# Patient Record
Sex: Male | Born: 2000 | Race: White | Hispanic: No | Marital: Single
Health system: Midwestern US, Community
[De-identification: ages and names within clinical notes are randomized; demographics above are authoritative.]

## PROBLEM LIST (undated history)

## (undated) DIAGNOSIS — T148XXA Other injury of unspecified body region, initial encounter: Secondary | ICD-10-CM

## (undated) DIAGNOSIS — N4400 Torsion of testis, unspecified: Secondary | ICD-10-CM

## (undated) DIAGNOSIS — G8929 Other chronic pain: Secondary | ICD-10-CM

## (undated) DIAGNOSIS — S43004A Unspecified dislocation of right shoulder joint, initial encounter: Principal | ICD-10-CM

## (undated) DIAGNOSIS — M545 Low back pain, unspecified: Secondary | ICD-10-CM

## (undated) DIAGNOSIS — M25311 Other instability, right shoulder: Principal | ICD-10-CM

## (undated) DIAGNOSIS — R102 Pelvic and perineal pain: Principal | ICD-10-CM

## (undated) DIAGNOSIS — Z0000 Encounter for general adult medical examination without abnormal findings: Principal | ICD-10-CM

## (undated) DIAGNOSIS — F419 Anxiety disorder, unspecified: Principal | ICD-10-CM

---
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
## 2022-12-09 ENCOUNTER — Emergency Department: Admit: 2022-12-10 | Payer: PRIVATE HEALTH INSURANCE

## 2022-12-09 DIAGNOSIS — N50812 Left testicular pain: Secondary | ICD-10-CM

## 2022-12-09 NOTE — ED Provider Notes (Signed)
[facility] EMERGENCY DEPT  EMERGENCY DEPARTMENT ENCOUNTER      Pt Name: Raymond Holder  MRN: [id number]  Birthdate 2001/03/18  Date of evaluation: 12/09/2022  Provider: Vanice Sarah, PA-C  4:00 AM    CHIEF COMPLAINT       Chief Complaint   Patient presents with    Abdominal Pain    Testicle Pain         HISTORY OF PRESENT ILLNESS    Raymond Holder is a 22 y.o. male who presents to the emergency department      22 y/o WM with c/o LLQ pain x 1 day.  He reports LLQ pain that radiates into his left testicle.  He denies testicular swelling, penile discharge, hematuria, n/v or flank pain. He had one episode of dysuria earlier today, but assumed it was due to be dehydrated. He reports h/o testicular torsion at 22 years old    The history is provided by the patient.       Nursing Notes were reviewed.    REVIEW OF SYSTEMS       Review of Systems   Constitutional: Negative.    HENT: Negative.     Respiratory: Negative.     Gastrointestinal:  Positive for abdominal pain.   Genitourinary:  Positive for testicular pain.   Musculoskeletal: Negative.    Skin: Negative.    Neurological: Negative.    Psychiatric/Behavioral: Negative.         Except as noted above the remainder of the review of systems was reviewed and negative.       PAST MEDICAL HISTORY   No past medical history on file.      SURGICAL HISTORY     No past surgical history on file.      CURRENT MEDICATIONS       Discharge Medication List as of 12/10/2022  1:43 AM          ALLERGIES     Patient has no known allergies.    FAMILY HISTORY     No family history on file.       SOCIAL HISTORY       Social History     Socioeconomic History    Marital status: Single       SCREENINGS         Glasgow Coma Scale  Eye Opening: Spontaneous  Best Verbal Response: Oriented  Best Motor Response: Obeys commands  Glasgow Coma Scale Score: 15                     CIWA Assessment  BP: 132/80  Pulse: 65                 PHYSICAL EXAM       ED Triage Vitals [12/09/22 2247]   BP Temp  Temp Source Pulse Respirations SpO2 Height Weight - Scale   137/82 98.8 F (37.1 C) Oral 79 18 97 % 1.88 m (6\' 2" ) 83.9 kg (185 lb)       Physical Exam  Vitals and nursing note reviewed.   Constitutional:       General: He is not in acute distress.     Appearance: He is well-developed. He is not ill-appearing.   HENT:      Head: Normocephalic and atraumatic.   Pulmonary:      Effort: Pulmonary effort is normal. No respiratory distress.   Abdominal:      General: Abdomen is flat.  Tenderness: There is abdominal tenderness in the left lower quadrant. There is no left CVA tenderness.   Genitourinary:     Testes:         Right: Swelling not present.         Left: Swelling not present.   Skin:     General: Skin is warm.      Findings: No rash.   Neurological:      Mental Status: He is oriented to person, place, and time.   Psychiatric:         Mood and Affect: Mood normal.         Behavior: Behavior normal.         DIAGNOSTIC RESULTS         RADIOLOGY:   Non-plain film images such as CT, Ultrasound and MRI are read by the radiologist. Plain radiographic images are visualized and preliminarily interpreted by the emergency physician with the below findings:        Interpretation per the Radiologist below, if available at the time of this note:    US SCROTUM W COMPLETE DUPLEX   Final Result      No evidence of intratesticular mass, torsion, or obvious vascular asymmetry.      CT ABDOMEN PELVIS WO CONTRAST Additional Contrast? None    (Results Pending)         LABS:  Labs Reviewed   URINALYSIS W/ RFLX MICROSCOPIC - Abnormal; Notable for the following components:       Result Value    Specific Gravity, UA >=1.030 (*)     All other components within normal limits   COMPREHENSIVE METABOLIC PANEL - Abnormal; Notable for the following components:    Potassium 3.4 (*)     Glucose 127 (*)     All other components within normal limits   C.TRACHOMATIS N.GONORRHOEAE T. VAGINALI, MOLECULAR, URINE    CBC WITH AUTO DIFFERENTIAL    LIPASE   MAGNESIUM       All other labs were within normal range or not returned as of this dictation.    EMERGENCY DEPARTMENT COURSE and DIFFERENTIAL DIAGNOSIS/MDM:   Vitals:    Vitals:    12/10/22 0100 12/10/22 0115 12/10/22 0130 12/10/22 0145   BP: 132/80      Pulse: 65      Resp: 16      Temp:       TempSrc:       SpO2: 97% 98% 98% 97%   Weight:       Height:               Medical Decision Making  Amount and/or Complexity of Data Reviewed  Labs: ordered.  Radiology: ordered.    Risk  Prescription drug management.          FINAL IMPRESSION      1. Abdominal pain, left lower quadrant    2. Testicular pain, left          DISPOSITION/PLAN   DISPOSITION Decision To Discharge 12/10/2022 01:41:57 AM      PATIENT REFERRED TO:  [facility] EMERGENCY DEPT  [address]  [place] [address]  [phone]    As needed      DISCHARGE MEDICATIONS:  Discharge Medication List as of 12/10/2022  1:43 AM        START taking these medications    Details   ibuprofen (ADVIL;MOTRIN) 800 MG tablet Take 1 tablet by mouth every 8 hours as needed for  Pain, Disp-20 tablet, R-0Print      tiZANidine (ZANAFLEX) 4 MG tablet Take 1 tablet by mouth every 8 hours as needed (muscle pain), Disp-15 tablet, R-0Print           Controlled Substances Monitoring:          No data to display                (Please note that portions of this note were completed with a voice recognition program.  Efforts were made to edit the dictations but occasionally words are mis-transcribed.)    Farida Mcreynolds Koleen Nimrod, PA-C (electronically signed)  Attending Emergency Physician           Vanice Sarah, PA-C  12/10/22 0400

## 2022-12-09 NOTE — ED Notes (Signed)
Patient not in waiting room to triage

## 2022-12-10 ENCOUNTER — Inpatient Hospital Stay
Admit: 2022-12-10 | Discharge: 2022-12-10 | Disposition: A | Discharge status: Home / Self Care | Payer: PRIVATE HEALTH INSURANCE

## 2022-12-10 ENCOUNTER — Emergency Department: Admit: 2022-12-10 | Payer: PRIVATE HEALTH INSURANCE

## 2022-12-10 LAB — CBC WITH AUTO DIFFERENTIAL
Basophils %: 0.6 % (ref 0.0–2.0)
Basophils Absolute: 0 10*3/uL (ref 0.0–0.2)
Eosinophils %: 2.4 % (ref 0.0–7.0)
Eosinophils Absolute: 0.2 10*3/uL (ref 0.0–0.5)
Hematocrit: 44.2 % (ref 38.0–52.0)
Hemoglobin: 15.8 g/dL (ref 13.0–17.3)
Immature Grans (Abs): 0.01 10*3/uL (ref 0.00–0.06)
Immature Granulocytes %: 0.1 % (ref 0.0–0.6)
Lymphocytes Absolute: 2.7 10*3/uL (ref 1.0–3.2)
Lymphocytes: 40.1 % (ref 15.0–45.0)
MCH: 31.9 pg (ref 27.0–34.5)
MCHC: 35.7 g/dL (ref 32.0–36.0)
MCV: 89.1 fL (ref 84.0–100.0)
MPV: 9.4 fL (ref 7.2–13.2)
Monocytes %: 7.4 % (ref 4.0–12.0)
Monocytes Absolute: 0.5 10*3/uL (ref 0.3–1.0)
NRBC Absolute: 0 10*3/uL (ref 0.000–0.012)
NRBC Automated: 0 % (ref 0.0–0.2)
Neutrophils %: 49.4 % (ref 42.0–74.0)
Neutrophils Absolute: 3.3 10*3/uL (ref 1.6–7.3)
Platelets: 198 10*3/uL (ref 140–440)
RBC: 4.96 x10e6/mcL (ref 4.00–5.60)
RDW: 11.5 % (ref 11.0–16.0)
WBC: 6.8 10*3/uL (ref 3.8–10.6)

## 2022-12-10 LAB — COMPREHENSIVE METABOLIC PANEL
ALT: 45 U/L (ref 0–50)
AST: 27 U/L (ref 0–50)
Albumin/Globulin Ratio: 1.59 (ref 1.00–2.70)
Albumin: 4.3 g/dL (ref 3.5–5.2)
Alk Phosphatase: 82 U/L (ref 40–130)
Anion Gap: 10 mmol/L (ref 2–17)
BUN: 19 mg/dL (ref 6–20)
CO2: 26 mmol/L (ref 22–29)
Calcium: 9.1 mg/dL (ref 8.5–10.7)
Chloride: 103 mmol/L (ref 98–107)
Creatinine: 0.8 mg/dL (ref 0.7–1.3)
Est, Glom Filt Rate: 128 mL/min/1.73m (ref >=60)
Globulin: 2.7 g/dL (ref 1.9–4.4)
Glucose: 127 mg/dL — ABNORMAL HIGH (ref 70–99)
Osmolaliy Calculated: 281 mOsm/kg (ref 270–287)
Potassium: 3.4 mmol/L — ABNORMAL LOW (ref 3.5–5.3)
Sodium: 139 mmol/L (ref 135–145)
Total Bilirubin: 1.1 mg/dL (ref 0.00–1.20)
Total Protein: 7 g/dL (ref 5.7–8.3)

## 2022-12-10 LAB — URINALYSIS W/ RFLX MICROSCOPIC
Bilirubin, Urine: NEGATIVE
Blood, Urine: NEGATIVE
Glucose, Ur: NEGATIVE mg/dL
Ketones, Urine: NEGATIVE mg/dL
Leukocyte Esterase, Urine: NEGATIVE
Nitrite, Urine: NEGATIVE
Protein, UA: NEGATIVE
Specific Gravity, UA: >=1.03 — AB (ref 1.003–1.035)
Urobilinogen, Urine: 1 EU/dL (ref >1.0)
pH, Urine: 5.5 (ref 4.5–8.0)

## 2022-12-10 LAB — MAGNESIUM: Magnesium: 2.1 mg/dL (ref 1.6–2.6)

## 2022-12-10 LAB — LIPASE: Lipase: 23 U/L (ref 13–60)

## 2022-12-10 MED ORDER — KETOROLAC TROMETHAMINE 15 MG/ML IJ SOLN
15 | Freq: Once | INTRAMUSCULAR | Status: AC
Start: 2022-12-10 — End: 2022-12-10
  Administered 2022-12-10: 04:00:00 15 mg via INTRAVENOUS

## 2022-12-10 MED ORDER — TIZANIDINE HCL 4 MG PO TABS
4 | ORAL_TABLET | Freq: Three times a day (TID) | ORAL | 0 refills | 28.00000 days | Status: DC | PRN
Start: 2022-12-10 — End: 2023-10-24

## 2022-12-10 MED ORDER — IBUPROFEN 800 MG PO TABS
800 | ORAL_TABLET | Freq: Three times a day (TID) | ORAL | 0 refills | 15.00000 days | Status: DC | PRN
Start: 2022-12-10 — End: 2023-12-05

## 2022-12-10 MED FILL — KETOROLAC TROMETHAMINE 15 MG/ML IJ SOLN: 15 MG/ML | INTRAMUSCULAR | Qty: 1

## 2022-12-11 LAB — C.TRACHOMATIS N.GONORRHOEAE T. VAGINALI, MOLECULAR, URINE
Chlamydia Trachomatis, NAA, Urine: NEGATIVE
Neisseria gonorrhoeae, NAA, Urine: NEGATIVE
Trichomonas vaginalis DNA, Urine: NEGATIVE

## 2023-10-24 ENCOUNTER — Ambulatory Visit: Admit: 2023-10-24 | Discharge: 2023-10-24 | Payer: PRIVATE HEALTH INSURANCE | Primary: Family Medicine

## 2023-10-24 ENCOUNTER — Ambulatory Visit
Admit: 2023-10-24 | Discharge: 2023-10-24 | Payer: PRIVATE HEALTH INSURANCE | Attending: Family Medicine | Primary: Family Medicine

## 2023-10-24 VITALS — BP 118/70 | HR 63 | Temp 98.30000°F | Resp 20 | Ht 74.0 in | Wt 183.4 lb

## 2023-10-24 DIAGNOSIS — M545 Low back pain, unspecified: Secondary | ICD-10-CM

## 2023-10-24 MED ORDER — NAPROXEN 500 MG PO TABS
500 | ORAL_TABLET | Freq: Two times a day (BID) | ORAL | 0 refills | Status: DC | PRN
Start: 2023-10-24 — End: 2023-11-18

## 2023-10-24 MED ORDER — CYCLOBENZAPRINE HCL 5 MG PO TABS
5 | ORAL_TABLET | Freq: Three times a day (TID) | ORAL | 0 refills | 10.00000 days | Status: DC | PRN
Start: 2023-10-24 — End: 2023-12-05

## 2023-10-24 NOTE — Progress Notes (Signed)
 Raymond Holder (DOB:  04-18-2001) is a 23 y.o. male, here for evaluation of the following chief complaint(s):  Back Pain (2 months ago, pt bent down in bathroom, felt sharp pain to lower back. Missed work for several days. Ongoing lower back discomfort and pain. Recently moved which made it worse. . No trauma or injury to spine. Took ibuprofen  for pain management. )        Assessment & Plan   ASSESSMENT/PLAN:  1. Chronic right-sided low back pain without sciatica  -     XR LUMBAR SPINE (2-3 VIEWS)  -     Amb External Referral To Physical Therapy  -     naproxen  (NAPROSYN ) 500 MG tablet; Take 1 tablet by mouth 2 times daily as needed for Pain Take with food., Disp-40 tablet, R-0Normal  -     cyclobenzaprine  (FLEXERIL ) 5 MG tablet; Take 1 tablet by mouth 3 times daily as needed for Muscle spasms, Disp-30 tablet, R-0Normal      Assessment & Plan  Chronic low back pain.  Potential disc bulge, lumbar strains possible. Tenderness in lower back, no issues with hip rotation or leg elevation. Discussed pathophysiology. Ordered spine x-ray. Referred to [facility] for 4 to 6 weeks. Prescribed naproxen  500 mg and Flexeril  10 mg. Advised to take naproxen  with food and avoid driving after Flexeril . If pain persists, consider spine MRI.    Follow-up in 5 weeks.       Return in about 5 weeks (around 11/28/2023) for back pain .       Subjective   SUBJECTIVE/OBJECTIVE:  HPI     History of Present Illness  The patient presents for evaluation of lower back pain.    Approximately 2 months ago, sudden, sharp pain occurred in the lower back while bending over. Similar episodes have occurred over the past 1 to 2 years without specific triggers. The most recent episode occurred while rising from the toilet, causing a pinching sensation and immobility for several days. Muscle relaxants, heat, and cold therapy were prescribed. Pain initially subsided but worsened after a recent move. Persistent discomfort described as a  tack-like sensation in the lower back has been experienced over the past few weeks. Movements are cautious to prevent aggravation. This is the third or fourth episode, occurring approximately 4 to 6 months apart. Pain occasionally radiates down the right leg but is not consistent. No weakness, numbness, or urinary/bowel issues reported.    PAST SURGICAL HISTORY: Testicular torsion surgery over 10 years ago. Shoulder anchors from dislocation in 2018.    SOCIAL HISTORY  Does not smoke or vape.    FAMILY HISTORY  Mother has had back troubles, including slipped disks.       Prior to Admission medications    Medication Sig Start Date End Date Taking? Authorizing Provider   naproxen  (NAPROSYN ) 500 MG tablet Take 1 tablet by mouth 2 times daily as needed for Pain Take with food. 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    Social History     Tobacco Use    Smoking status: Never    Smokeless tobacco: Never   Vaping Use    Vaping status: Never Used   Substance Use Topics    Alcohol use: Not Currently     Comment: socially    Drug use: Never          Review of Systems     Objective       Vitals:    10/24/23 1359   BP: 118/70   BP Site: Left Upper Arm   Patient Position: Sitting   BP Cuff Size: Medium Adult   Pulse: 63   Resp: 20   Temp: 98.3 F (36.8 C)   TempSrc: Oral   SpO2: 97%   Weight: 83.2 kg (183 lb 6.4 oz)   Height: 1.88 m (6\' 2" )        BP Readings from Last 3 Encounters:    10/24/23 118/70   12/10/22 132/80     Wt Readings from Last 3 Encounters:   10/24/23 83.2 kg (183 lb 6.4 oz)   12/09/22 83.9 kg (185 lb)       Physical Exam  GEN-No acute distress.  HEENT-Neck is supple, moist mucosa.   CV-Regular rate and rhythm. No murmurs, gallops, or rubs.   LUNGS-Normal respiratory effort. Clear to auscultation bilaterally. Good air movement.  EXT-No edema.   PSYCH-Normal mood and affect.          Admission on 12/09/2022, Discharged on 12/10/2022   Component Date Value Ref Range Status    Color, UA 12/09/2022 Yellow   Final    Appearance 12/09/2022 Clear  Clear Final    Glucose, Ur 12/09/2022 Negative  Negative mg/dL Final    Bilirubin, Urine 12/09/2022 Negative  Negative Final    Ketones, Urine 12/09/2022 Negative  Negative mg/dL Final    Specific Gravity, UA 12/09/2022 >=1.030 (A)  1.003 - 1.035 Final    Blood, Urine 12/09/2022 Negative  Negative Final    pH, Urine 12/09/2022 5.5  4.5 - 8.0 Final    Protein, UA 12/09/2022 Negative   Final    Urobilinogen, Urine 12/09/2022 1.0  >1.0 EU/dL Final    Nitrite, Urine 12/09/2022 Negative  Negative Final    Leukocyte Esterase, Urine 12/09/2022 Negative  Negative Final    Chlamydia Trachomatis, NAA, Urine 12/09/2022 Negative  Negative Final    Comment: Testing performed by amplified nucleic acid probe.    Culture, rather than a molecular assay, is the only recommended procedure for  diagnosing infection in case of suspected child abuse.  As in any clinical  situation, diagnosis should not be based on the results of a single laboratory  test.    Endocervical, vaginal, and urine specimens are specimens of choice for females  for this test.  Urine is the only acceptable source for males.      Neisseria gonorrhoeae, NAA, Urine 12/09/2022 Negative  Negative Final    Comment: Testing performed by amplified nucleic acid probe.    Culture, rather than a molecular assay, is the only recommended procedure for  diagnosing infection in case of suspected child  abuse.  As in any clinical  situation, diagnosis should not be based on the results of a single laboratory  test.  Endocervical, vaginal, and urine specimens are specimens of choice for females  for this test.  Urine is the only acceptable source for males.      Trichomonas vaginalis DNA, Urine 12/09/2022 Negative  Negative Final    WBC 12/09/2022 6.8  3.8 - 10.6 x10e3/mcL Final    RBC 12/09/2022 4.96  4.00 - 5.60 x10e6/mcL Final    Hemoglobin 12/09/2022 15.8  13.0 - 17.3 g/dL Final    Hematocrit 40/98/1191 44.2  38.0 - 52.0 % Final    MCV 12/09/2022 89.1  84.0 - 100.0 fL Final    MCH 12/09/2022 31.9  27.0 - 34.5 pg Final    MCHC 12/09/2022 35.7  32.0 - 36.0 g/dL Final    RDW 47/82/9562 11.5  11.0 - 16.0 % Final    Platelets 12/09/2022 198  140 - 440 x10e3/mcL Final    MPV 12/09/2022 9.4  7.2 - 13.2 fL Final    NRBC Automated 12/09/2022 0.0  0.0 - 0.2 % Final    NRBC Absolute 12/09/2022 0.000  0.000 - 0.012 x10e3/mcL Final    Neutrophils % 12/09/2022 49.4  42.0 - 74.0 % Final    Lymphocytes 12/09/2022 40.1  15.0 - 45.0 % Final    Monocytes % 12/09/2022 7.4  4.0 - 12.0 % Final    Eosinophils % 12/09/2022 2.4  0.0 - 7.0 % Final    Basophils % 12/09/2022 0.6  0.0 - 2.0 % Final    Neutrophils Absolute 12/09/2022 3.3  1.6 - 7.3 x10e3/mcL Final    Lymphocytes Absolute 12/09/2022 2.7  1.0 - 3.2 x10e3/mcL Final    Monocytes Absolute 12/09/2022 0.5  0.3 - 1.0 x10e3/mcL Final    Eosinophils Absolute 12/09/2022 0.2  0.0 - 0.5 x10e3/mcL Final    Basophils Absolute 12/09/2022 0.0  0.0 - 0.2 x10e3/mcL Final    Immature Granulocytes % 12/09/2022 0.1  0.0 - 0.6 % Final    Immature Grans (Abs) 12/09/2022 0.01  0.00 - 0.06 x10e3/mcL Final    Sodium 12/09/2022 139  135 - 145 mmol/L Final    Potassium 12/09/2022 3.4 (L)  3.5 - 5.3 mmol/L Final    Chloride 12/09/2022 103  98 - 107 mmol/L Final    CO2 12/09/2022 26  22 - 29 mmol/L Final    Glucose 12/09/2022 127 (H)  70 - 99 mg/dL Final    BUN 13/01/6577 19  6 - 20 mg/dL Final     Creatinine 46/96/2952 0.8  0.7 - 1.3 mg/dL Final    Anion Gap 84/13/2440 10  2 - 17 mmol/L Final    Osmolaliy Calculated 12/09/2022 281  270 - 287 mOsm/kg Final    Calcium 12/09/2022 9.1  8.5 - 10.7 mg/dL Final    Total Protein 12/09/2022 7.0  5.7 - 8.3 g/dL Final    Albumin 04/24/2535 4.3  3.5 - 5.2 g/dL Final    Globulin 64/40/3474 2.7  1.9 - 4.4 g/dL Final    Albumin/Globulin Ratio 12/09/2022 1.59  1.00 - 2.70 Final    Total Bilirubin 12/09/2022 1.10  0.00 - 1.20 mg/dL Final    Alk Phosphatase 12/09/2022 82  40 - 130 unit/L Final    AST 12/09/2022 27  0 - 50 unit/L Final    ALT 12/09/2022 45  0 - 50 unit/L Final    Est, Glom Filt Rate 12/09/2022 128  >=60 mL/min/1.18m Final    Comment: VERIFIED by Discern Expert.  GFR Interpretation:                                                                         %  OF  KIDNEY  GFR                                                        STAGE  FUNCTION  ==================================================================================    > 90        Normal kidney function                       STAGE 1  90-100%  89 to 60      Mild loss of kidney function                 STAGE 2  80-60%  59 to 45      Mild to moderate loss of kidney function     STAGE 3a  59-45%  44 to 30      Moderate to severe loss of kidney function   STAGE 3b  44-30%  29 to 15      Severe loss of kidney function               STAGE 4  29-15%    < 15        Kidney failure                               STAGE 5  <15%  ==================================================================================  Modified from [employer]    GFR Calculation performed using the CKD-EPI 2021 equation developed for use  with IDMS traceable creatinine methods and                            is the calculation recommended by  the [facility] for estimating GFR in adults.      Lipase 12/09/2022 23  13 - 60 unit/L Final    Comment: Effective 04/26/15 Lipase Methodology Change - Lipase Reference  Range is  specific for the Roche Enzymatic colorimetric method.    Reference range changed on 07/25/15 from 3-300 unit/L to 13-60 unit/L      Magnesium 12/09/2022 2.1  1.6 - 2.6 mg/dL Final      No results found for this visit on 10/24/23.     The patient (or guardian, if applicable) and other individuals in attendance with the patient were advised that Artificial Intelligence will be utilized during this visit to record and process the conversation to generate a clinical note. The patient (or guardian, if applicable) and other individuals in attendance at the appointment consented to the use of AI, including the recording.                          An electronic signature was used to authenticate this note.    --Dessie Flow, MD

## 2023-10-31 ENCOUNTER — Encounter

## 2023-10-31 NOTE — Telephone Encounter (Signed)
-----   Message from Dr. Ahmad Hotter, MD sent at 10/31/2023  4:10 PM EDT -----  Please let Raymond Holder know that his x-ray showed a possible fracture of an outer piece of bone of the first vertebra in the lumbar spine called the L1 transverse process. The radiologist couldn't tell with certainty whether or not a fracture is present. I'd like to check an MRI of the spine to further assess and am placing the order now.

## 2023-11-15 ENCOUNTER — Emergency Department: Admit: 2023-11-15 | Payer: PRIVATE HEALTH INSURANCE | Primary: Family Medicine

## 2023-11-15 ENCOUNTER — Encounter: Payer: PRIVATE HEALTH INSURANCE | Attending: Family Medicine | Primary: Family Medicine

## 2023-11-15 ENCOUNTER — Inpatient Hospital Stay
Admit: 2023-11-15 | Discharge: 2023-11-15 | Disposition: A | Discharge status: Home / Self Care | Payer: PRIVATE HEALTH INSURANCE

## 2023-11-15 DIAGNOSIS — R102 Pelvic and perineal pain: Secondary | ICD-10-CM

## 2023-11-15 LAB — URINALYSIS W/ RFLX MICROSCOPIC
Bilirubin, Urine: NEGATIVE
Blood, Urine: NEGATIVE
Glucose, Ur: NEGATIVE
Ketones, Urine: NEGATIVE
Leukocyte Esterase, Urine: NEGATIVE
Nitrite, Urine: NEGATIVE
Protein, UA: NEGATIVE
Specific Gravity, UA: 1.015 (ref 1.003–1.035)
Urobilinogen, Urine: 0.2 EU/dL (ref 0.2–1.0)
pH, Urine: 6.5 (ref 4.5–8.0)

## 2023-11-15 LAB — CBC WITH AUTO DIFFERENTIAL
Basophils %: 0.3 % (ref 0.0–2.0)
Basophils Absolute: 0 10*3/uL (ref 0.0–0.2)
Eosinophils %: 1.5 % (ref 0.0–7.0)
Eosinophils Absolute: 0.1 10*3/uL (ref 0.0–0.5)
Hematocrit: 40.5 % (ref 38.0–52.0)
Hemoglobin: 13.7 g/dL (ref 13.0–17.3)
Immature Grans (Abs): 0 10*3/uL (ref 0.00–0.06)
Immature Granulocytes %: 0 % (ref 0.0–0.6)
Lymphocytes Absolute: 2.2 10*3/uL (ref 1.0–3.2)
Lymphocytes: 32.9 % (ref 15.0–45.0)
MCH: 31.1 pg (ref 27.0–34.5)
MCHC: 33.8 g/dL (ref 30.0–36.0)
MCV: 92 fL (ref 84.0–100.0)
MPV: 9.7 fL (ref 7.0–12.2)
Monocytes %: 7.1 % (ref 4.0–12.0)
Monocytes Absolute: 0.5 10*3/uL (ref 0.3–1.0)
Neutrophils %: 58.2 % (ref 42.0–74.0)
Neutrophils Absolute: 3.9 10*3/uL (ref 1.6–7.3)
Platelets: 173 10*3/uL (ref 140–440)
RBC: 4.4 x10e6/mcL (ref 4.00–5.60)
RDW: 12 % (ref 10.0–17.0)
WBC: 6.7 10*3/uL (ref 3.8–10.6)

## 2023-11-15 LAB — COMPREHENSIVE METABOLIC PANEL
ALT: 38 U/L (ref 0–42)
AST: 27 U/L (ref 0–46)
Albumin/Globulin Ratio: 1.78 (ref 1.00–2.70)
Albumin: 4.1 g/dL (ref 3.5–5.2)
Alk Phosphatase: 70 U/L (ref 40–130)
Anion Gap: 10 mmol/L (ref 2–17)
BUN: 19 mg/dL (ref 6–20)
CO2: 27 mmol/L (ref 22–29)
Calcium: 8.9 mg/dL (ref 8.5–10.7)
Chloride: 103 mmol/L (ref 98–107)
Creatinine: 0.9 mg/dL (ref 0.7–1.3)
Est, Glom Filt Rate: 122 mL/min/1.73mÂ² (ref >=60)
Globulin: 2.3 g/dL (ref 1.9–4.4)
Glucose: 93 mg/dL (ref 70–99)
Osmolaliy Calculated: 279 mosm/kg (ref 270–287)
Potassium: 4.1 mmol/L (ref 3.5–5.3)
Sodium: 139 mmol/L (ref 135–145)
Total Bilirubin: 0.73 mg/dL (ref 0.00–1.20)
Total Protein: 6.5 g/dL (ref 5.7–8.3)

## 2023-11-15 MED ORDER — IOPAMIDOL 61 % IV SOLN
61 | Freq: Once | INTRAVENOUS | Status: AC | PRN
Start: 2023-11-15 — End: 2023-11-15
  Administered 2023-11-15: 22:00:00 100 mL via INTRAVENOUS

## 2023-11-15 MED FILL — ISOVUE-300 61 % IV SOLN: 61 % | INTRAVENOUS | Qty: 100 | Fill #0

## 2023-11-15 NOTE — ED Provider Notes (Cosign Needed)
 [facility] EMERGENCY DEPARTMENT  EMERGENCY DEPARTMENT ENCOUNTER      Pt Name: Raymond Holder  MRN: [id number]  Birthdate August 12, 2000  Date of evaluation: 11/15/2023  Provider: Obed Bellows Gertha Lichtenberg, PA-C  10:57 PM    CHIEF COMPLAINT       Chief Complaint   Patient presents with    Groin Pain     Pt was fishing and felt a tear in his groin area about a week ago.  Pt states it has gotten progressively worse.         HISTORY OF PRESENT ILLNESS    Raymond Holder is a 23 y.o. male who presents to the emergency department with pain to his perineum area.  Patient states about a week and a half ago he was fishing when he felt something tear.  He states throughout the past week or so that the pain has been worsening.  No testicular pain.  No urinary symptoms or discharge.  No fever or chills.  No issues with bowel movements.    Nursing Notes were reviewed.    REVIEW OF SYSTEMS       ROS as documented in HPI unless otherwise stated below.     PHYSICAL EXAM       ED Triage Vitals [11/15/23 1526]   BP Systolic BP Percentile Diastolic BP Percentile Temp Temp Source Pulse Respirations SpO2   (!) 144/92 -- -- 98.5 F (36.9 C) Oral 90 20 97 %      Height Weight - Scale         1.88 m (6\' 2" ) 83.9 kg (185 lb)             General: NAD, well-appearing  HEENT: NCAT, EOMI, normal conjunctiva, moist mucous membranes  Neck: Full range of motion, no observable masses  Respiratory: Airway patent, no respiratory distress, normal effort, CTAB, no wheezes or stridor  Cardiovascular: RRR, no edema  Abdomen: Soft, nondistended, nontender abdomen, no rebound tenderness  GU: Tenderness to perineum just at the base of the scrotum.  No associated swelling, redness, warmth.  No tenderness or mass noted to testicles  MSK: Moving all extremities, no deformities  Skin: Warm and dry, no rashes  Neuro: Awake and alert, normal speech, no focal deficit, at baseline  Psych: Cooperative, appropriate for situation    EMERGENCY  DEPARTMENT COURSE and DIFFERENTIAL DIAGNOSIS/MDM:   Vitals:    Vitals:    11/15/23 1526 11/15/23 1840 11/15/23 1930   BP: (!) 144/92 139/85 132/74   Pulse: 90 87 80   Resp: 20 16 15    Temp: 98.5 F (36.9 C)     TempSrc: Oral     SpO2: 97% 99% 99%   Weight: 83.9 kg (185 lb)     Height: 1.88 m (6\' 2" )         Medical Decision Making  Patient presents with some pain to his perineum over the past week or so.  He is well-appearing on exam with stable vital signs.  No fever.  Abdominal/genitalia exam is reassuring.  No true testicular pain or swelling.  CT imaging with no acute process, no abscess seen.  Testicular ultrasound is also reassuring with no torsion or mass.  Patient's lab work is all reassuring and urinalysis unremarkable.  Low suspicion for a prostatitis or other infectious cause.  Will have patient monitor symptoms and continue with OTC Tylenol/Motrin .  After evaluation today in the emergency department, the patient is safe and appropriate for  discharge. They have been instructed to follow up with an outpatient provider as detailed below, or to return to the emergency department if their condition worsens or they develop any concerning new symptoms.    Amount and/or Complexity of Data Reviewed  Labs: ordered.  Radiology: ordered.    Risk  Prescription drug management.        REASSESSMENT        CONSULTS:  None    PROCEDURES:  Unless otherwise noted below, none     Procedures    FINAL IMPRESSION      1. Perineum pain, male          DISPOSITION/PLAN   DISPOSITION Decision To Discharge 11/15/2023 07:23:39 PM    PATIENT REFERRED TO:  Dessie Flow, MD  [address]  [place] [address]  [phone]    Schedule an appointment as soon as possible for a visit in 1 week      Primary Care Doctor  [phone]  Schedule an appointment as soon as possible for a visit in 1 week  Please follow-up with your primary care provider. Call this number if you need to get established with a  PCP.      DISCHARGE MEDICATIONS:  Discharge Medication List as of 11/15/2023  7:24 PM          DIAGNOSTIC RESULTS     EKG: All EKG's are interpreted by the Emergency Department Physician who either signs or Co-signs this chart in the absence of a cardiologist.    See ED course    RADIOLOGY:   Non-plain film images such as CT, Ultrasound and MRI are read by the radiologist. Plain radiographic images are visualized and preliminarily interpreted by the emergency physician with the below findings:    Interpretation per the Radiologist below, if available at the time of this note:    US  DUP ABD PEL RETRO SCROT LIMITED   Final Result   No evidence of testicular torsion or epididymoorchitis. No intrinsic testicular    masses.      US  SCROTUM AND TESTICLES   Final Result   No evidence of testicular torsion or epididymoorchitis. No intrinsic testicular    masses.      CT PELVIS W CONTRAST Additional Contrast? None   Final Result      No evidence of acute process in the pelvis. If there is concern for underlying    musculoskeletal or soft tissue injury, consider nonemergent follow-up MRI for    further evaluation.                                        ED BEDSIDE ULTRASOUND:   Performed by ED Physician - none    LABS:  Labs Reviewed   URINALYSIS W/ RFLX MICROSCOPIC   CBC WITH AUTO DIFFERENTIAL   COMPREHENSIVE METABOLIC PANEL       All other labs were within normal range or not returned as of this dictation.    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    TESTICLE TORSION REDUCTION  2018  CURRENT MEDICATIONS       Discharge Medication List as of 11/15/2023  7:24 PM        CONTINUE these medications which have NOT CHANGED    Details   naproxen  (NAPROSYN ) 500 MG tablet Take 1 tablet by mouth 2 times daily as needed  for Pain Take with food., Disp-40 tablet, R-0Normal      cyclobenzaprine  (FLEXERIL ) 5 MG tablet Take 1 tablet by mouth 3 times daily as needed for Muscle spasms, Disp-30 tablet, R-0Normal      ibuprofen  (ADVIL ;MOTRIN ) 800 MG tablet Take 1 tablet by mouth every 8 hours as needed for Pain, Disp-20 tablet, R-0Print             ALLERGIES     Patient has no known allergies.    FAMILY HISTORY     No family history on file.     SOCIAL HISTORY       Social History     Socioeconomic History    Marital status: Single   Tobacco Use    Smoking status: Never    Smokeless tobacco: Never   Vaping Use    Vaping status: Never Used   Substance and Sexual Activity    Alcohol use: Not Currently     Comment: socially    Drug use: Never       (Please note that portions of this note were completed with a voice recognition program.  Efforts were made to edit the dictations but occasionally words are mis-transcribed.)    Darolyn Elks, PA-C (electronically signed)  Emergency Medicine Physician Assistant       Savier Trickett, Obed Bellows, PA-C  11/15/23 2257

## 2023-11-15 NOTE — Discharge Instructions (Signed)
 You were seen today for pain in your perineum area.  Lab work, urine, and imaging studies are all reassuring.  No signs of infection or testicular torsion.  Please continue to monitor your symptoms.    We recommend you take 600mg  ibuprofen  every 6 hours or tylenol 650mg  every 6 hours as needed for pain. If needed, you can alternate these medications so that you take one medication every 3 hours. For instance, at noon take ibuprofen , then at 3pm take tylenol, then at 6pm take ibuprofen .     If you experience any worsening symptoms, worsening pain, fever, vomiting, urinary symptoms, please return for evaluation.

## 2023-11-17 ENCOUNTER — Encounter: Payer: PRIVATE HEALTH INSURANCE | Attending: Family Medicine | Primary: Family Medicine

## 2023-11-17 ENCOUNTER — Inpatient Hospital Stay: Admit: 2023-11-17 | Payer: PRIVATE HEALTH INSURANCE | Attending: Family Medicine | Primary: Family Medicine

## 2023-11-17 ENCOUNTER — Encounter: Attending: Family Medicine | Primary: Family Medicine

## 2023-11-17 DIAGNOSIS — G8929 Other chronic pain: Secondary | ICD-10-CM

## 2023-11-17 DIAGNOSIS — M545 Low back pain, unspecified: Secondary | ICD-10-CM

## 2023-11-18 ENCOUNTER — Ambulatory Visit
Admit: 2023-11-18 | Discharge: 2023-11-18 | Payer: PRIVATE HEALTH INSURANCE | Attending: Family Medicine | Primary: Family Medicine

## 2023-11-18 VITALS — BP 122/74 | HR 60 | Temp 97.80000°F | Resp 17 | Ht 74.02 in | Wt 182.0 lb

## 2023-11-18 DIAGNOSIS — S76211A Strain of adductor muscle, fascia and tendon of right thigh, initial encounter: Secondary | ICD-10-CM

## 2023-11-18 MED ORDER — NAPROXEN 500 MG PO TABS
500 | ORAL_TABLET | Freq: Two times a day (BID) | ORAL | 0 refills | 30.00000 days | Status: DC
Start: 2023-11-18 — End: 2023-12-05

## 2023-11-19 NOTE — Progress Notes (Signed)
 Raymond Holder (DOB:  [birth year]) is a 23 y.o. male, here for evaluation of the following chief complaint(s):  Follow-up (Discuss groin strain /Pt had an er visit 5/20 regarding this strain and his pain is still consistent.)        Assessment & Plan   ASSESSMENT/PLAN:  1. Inguinal strain, right, initial encounter  -     naproxen  (NAPROSYN ) 500 MG tablet; Take 1 tablet by mouth 2 times daily (with meals) for 10 days Take with food., Disp-20 tablet, R-0Normal  -     Amb External Referral To Physical Therapy      Assessment & Plan  Groin pain.  Symptoms suggest groin strain. Physical exam revealed pain with hip adduction and dull pain with leg lifting, no significant tenderness upon palpation.  Prescribed naproxen  500 mg twice daily with meals. Advised alternating ice and heating pad for 20 minutes each, every 2 to 3 hours at home. Referred to physical therapy. If no improvement within 2 weeks, MRI will be ordered. Referral to sports medicine orthopedist considered if necessary.       Return if symptoms worsen or fail to improve (as scheduled).       Subjective   SUBJECTIVE/OBJECTIVE:  HPI     History of Present Illness  The patient presents for evaluation of groin pain.    Approximately one year ago, he experienced a groin strain while playing beach volleyball. A few weeks ago, he felt intense pain while throwing a heavy cast net, suggesting a possible tear. This pain worsened during a fishing tournament, prompting an ER visit. Despite scans and urine tests indicating a muscular issue, he believes the problem is deeper within his pelvis, possibly involving cartilage.    Since the ER visit, he has experienced constant burning pain in a specific spot, with general aching and bruising in the pelvic region. Ibuprofen  and ice provide temporary relief. Sitting in comfortable positions and driving exacerbate the pain. He seeks a second opinion to confirm if the issue is muscular and explore treatment options.    He has a  history of playing soccer in college and stretching his groin, and wonders if stopping these activities contributed to the issue. The pain is deep, behind the scrotum, and has lessened compared to the previous two days but persists while sitting in traffic. He fears spreading his legs due to pain. He is currently seeing [facility] for his back and had to cancel an appointment due to groin pain. He is considering rescheduling and incorporating groin exercises. He seeks advice on pain management, including sitting positions, icing regimens, and ibuprofen  use.    He had an MRI for his back yesterday and has a follow-up scheduled.       Prior to Admission medications    Medication Sig Start Date End Date Taking? Authorizing Provider   naproxen  (NAPROSYN ) 500 MG tablet Take 1 tablet by mouth 2 times daily (with meals) for 10 days Take with food. 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    Social History     Tobacco Use    Smoking status: Never    Smokeless tobacco: Never   Vaping Use    Vaping status: Never Used   Substance Use Topics    Alcohol use: Not Currently     Comment: socially    Drug use: Never          Review of Systems     Objective       Vitals:    11/18/23 0815   BP: 122/74   BP Site: Left Upper Arm   Patient Position: Sitting   BP Cuff Size: Large  Adult   Pulse: 60   Resp: 17   Temp: 97.8 F (36.6 C)   TempSrc: Oral   SpO2: 98%   Weight: 82.6 kg (182 lb)   Height: 1.88 m (6' 2.02")        BP Readings from Last 3 Encounters:   11/18/23 122/74   11/15/23 132/74   10/24/23 118/70     Wt Readings from Last 3 Encounters:   11/18/23 82.6 kg (182 lb)   11/15/23 83.9 kg (185 lb)   10/24/23 83.2 kg (183 lb 6.4 oz)       Physical Exam  GEN-No acute distress.  HEENT-Neck is supple, moist mucosa.   LUNGS-Normal respiratory effort.  EXT-    R hip exam-No tenderness. Resisted hip flexion and adduction are painful.     PSYCH-Normal mood and affect.          Admission on 11/15/2023, Discharged on 11/15/2023   Component Date Value Ref Range Status    Color, UA 11/15/2023 Yellow   Final    Appearance 11/15/2023 Clear   Final    Glucose, Ur 11/15/2023 Negative  Negative Final    Bilirubin, Urine 11/15/2023 Negative  Negative Final    Ketones, Urine 11/15/2023 Negative  Negative Final    Specific Gravity, UA 11/15/2023 1.015  1.003 - 1.035 Final    Blood, Urine 11/15/2023 Negative  Negative Final    pH, Urine 11/15/2023 6.5  4.5 - 8.0 Final    Protein, UA 11/15/2023 Negative  Negative Final    Urobilinogen, Urine 11/15/2023 0.2  0.2 - 1.0 EU/dL Final    Nitrite, Urine 11/15/2023 Negative  Negative Final    Leukocyte Esterase, Urine 11/15/2023 Negative  Negative Final    WBC 11/15/2023 6.7  3.8 - 10.6 x10e3/mcL Final    RBC 11/15/2023 4.40  4.00 - 5.60 x10e6/mcL Final    Hemoglobin 11/15/2023 13.7  13.0 - 17.3 g/dL Final    Hematocrit 16/03/9603 40.5  38.0 - 52.0 % Final    MCV 11/15/2023 92.0  84.0 - 100.0 fL Final    MCH 11/15/2023 31.1  27.0 - 34.5 pg Final    MCHC 11/15/2023 33.8  30.0 - 36.0 g/dL Final    RDW 54/02/8118 12.0  10.0 - 17.0 % Final    Platelets 11/15/2023 173  140 - 440 x10e3/mcL Final    MPV 11/15/2023 9.7  7.0 - 12.2 fL Final    Neutrophils % 11/15/2023 58.2  42.0 - 74.0 % Final    Lymphocytes 11/15/2023  32.9  15.0 - 45.0 % Final    Monocytes % 11/15/2023  7.1  4.0 - 12.0 % Final    Eosinophils % 11/15/2023 1.5  0.0 - 7.0 % Final    Basophils % 11/15/2023 0.3  0.0 - 2.0 % Final    Neutrophils Absolute 11/15/2023 3.9  1.6 - 7.3 x10e3/mcL Final    Lymphocytes Absolute 11/15/2023 2.2  1.0 - 3.2 x10e3/mcL Final    Monocytes Absolute 11/15/2023 0.5  0.3 - 1.0 x10e3/mcL Final    Eosinophils Absolute 11/15/2023 0.1  0.0 - 0.5 x10e3/mcL Final    Basophils Absolute 11/15/2023 0.0  0.0 - 0.2 x10e3/mcL Final    Immature Granulocytes % 11/15/2023 0.0  0.0 - 0.6 % Final    Immature Grans (Abs) 11/15/2023 0.00  0.00 - 0.06 x10e3/mcL Final    Sodium 11/15/2023 139  135 - 145 mmol/L Final    Potassium 11/15/2023 4.1  3.5 - 5.3 mmol/L Final    Chloride 11/15/2023 103  98 - 107 mmol/L Final    CO2 11/15/2023 27  22 - 29 mmol/L Final    Glucose 11/15/2023 93  70 - 99 mg/dL Final    BUN 36/64/4034 19  6 - 20 mg/dL Final    Creatinine 74/25/9563 0.9  0.7 - 1.3 mg/dL Final    Anion Gap 87/56/4332 10  2 - 17 mmol/L Final    Osmolaliy Calculated 11/15/2023 279  270 - 287 mOsm/kg Final    Calcium 11/15/2023 8.9  8.5 - 10.7 mg/dL Final    Total Protein 11/15/2023 6.5  5.7 - 8.3 g/dL Final    Albumin 95/18/8416 4.1  3.5 - 5.2 g/dL Final    Globulin 60/63/0160 2.3  1.9 - 4.4 g/dL Final    Albumin/Globulin Ratio 11/15/2023 1.78  1.00 - 2.70 Final    Total Bilirubin 11/15/2023 0.73  0.00 - 1.20 mg/dL Final    Alk Phosphatase 11/15/2023 70  40 - 130 unit/L Final    AST 11/15/2023 27  0 - 46 unit/L Final    ALT 11/15/2023 38  0 - 42 unit/L Final    Est, Glom Filt Rate 11/15/2023 122  >=60 mL/min/1.34m Final    Comment: VERIFIED by Discern Expert.  GFR Interpretation:                                                                         % OF  KIDNEY  GFR                                                        STAGE  FUNCTION  ==================================================================================    > 90        Normal kidney function                       STAGE 1  90-100%  89 to 60       Mild loss of kidney function                 STAGE 2  80-60%  59 to 45      Mild to moderate loss of kidney function     STAGE 3a  59-45%  44 to 30      Moderate to severe loss of kidney function   STAGE 3b  44-30%  29 to 15      Severe loss of kidney function               STAGE 4  29-15%    < 15        Kidney failure                               STAGE 5  <15%  ==================================================================================  Modified from [employer]    GFR Calculation performed using the CKD-EPI 2021 equation developed for use  with IDMS traceable creatinine methods and                            is the calculation recommended by  the [facility] for estimating GFR in adults.        No results found for this visit on 11/18/23.     The patient (or guardian, if applicable) and other individuals in attendance with the patient were advised that Artificial Intelligence will be utilized during this visit to record and process the conversation to generate a clinical note. The patient (or guardian, if applicable) and other individuals in attendance at the appointment consented to the use of AI, including the recording.                          An electronic signature was used to authenticate this note.    --Dessie Flow, MD

## 2023-11-22 ENCOUNTER — Encounter: Payer: PRIVATE HEALTH INSURANCE | Attending: Family Medicine | Primary: Family Medicine

## 2023-11-28 NOTE — Telephone Encounter (Signed)
-----   Message from Dr. Ahmad Hotter, MD sent at 11/26/2023  2:04 PM EDT -----  His MRI showed a small fracture of one of the vertebrae in the spine at L1 and a bulging disc L5-S1. We'll discuss further at the upcoming appointment.

## 2023-11-30 ENCOUNTER — Ambulatory Visit
Admit: 2023-11-30 | Discharge: 2023-11-30 | Payer: PRIVATE HEALTH INSURANCE | Attending: Family Medicine | Primary: Family Medicine

## 2023-11-30 VITALS — BP 130/86 | HR 93 | Resp 18 | Wt 183.0 lb

## 2023-11-30 DIAGNOSIS — G8929 Other chronic pain: Secondary | ICD-10-CM

## 2023-11-30 DIAGNOSIS — F419 Anxiety disorder, unspecified: Secondary | ICD-10-CM

## 2023-11-30 MED ORDER — SIMETHICONE 180 MG PO CAPS
180 | ORAL_CAPSULE | Freq: Three times a day (TID) | ORAL | 1 refills | 15.00000 days | Status: DC | PRN
Start: 2023-11-30 — End: 2024-07-26

## 2023-12-05 NOTE — Progress Notes (Signed)
 Raymond Holder (DOB:  [birth year]) is a 23 y.o. male, here for evaluation of the following chief complaint(s):  Follow-up        Assessment & Plan   ASSESSMENT/PLAN:  1. Chronic right-sided low back pain without sciatica  2. Bulging lumbar disc  3. Other closed fracture of first lumbar vertebra, sequela  4. Anxiety  -     Amb External Referral To Counseling Services  5. Abdominal cramping  -     simethicone  (MYLICON) 180 MG capsule; Take 1 capsule by mouth every 8 hours as needed (abdominal cramping), Disp-180 capsule, R-1Normal  6. Heel pain, unspecified laterality      Assessment & Plan  Back pain.  Likely due to bulging disk at L5-S1, not old fracture at L1 transverse process (seen on recent MRI L spine). Advised to maintain strong core muscles, keep weight close to chest when bending, avoid spine-compressing exercises. Recommended dumbbell exercises. Continue physical therapy, perform stretches and exercises consistently. Use naproxen  for flare-ups. Discussed oral steroids, steroid injections, or surgical intervention if condition worsens.    Anxiety.  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 Rx for simethicone . Recommended counseling. Referral to [facility] provided. Contact them directly if no response within a week.    Heel pain.  Consistent with plantar fasciitis. Advised to wear shoes with good arch support, avoid flip-flops. Recommended stretching exercises for calf and plantar fascia, use frozen water bottle for massaging plantar fascia, shoe inserts.    Follow-up in 3 months.       Return in about 3 months (around 03/01/2024) for routine .       Subjective   SUBJECTIVE/OBJECTIVE:  HPI     History of Present Illness  The patient presents for evaluation of back pain, anxiety, and heel pain.    He reports a history of tailbone fracture 3 years ago from jumping into water. No major back injuries since. 1.5 years ago, he experienced significant back pain after lifting a heavy cooler.  Pain is localized to the lower right back, non-radiating. Physical therapy has been beneficial, but he missed sessions due to groin discomfort. Reports intermittent shooting pain every 6 months, triggered by bending over.    Experiencing anxiety contributing to gastrointestinal symptoms, including upset stomach and diarrhea, for 7-8 years. Symptoms exacerbated by recent job loss. Interested in counseling. No gluten sensitivity, blood, or mucus in stool.    Reports heel pain, sensation of standing on bone, relieved by [employer] shoes. Discomfort increased with other footwear, especially flip-flops. Unable to stand barefoot on hard surfaces for extended periods. Discomfort more pronounced in left foot. History of playing soccer.    Groin pain improving, moving without discomfort. Medication helped. Still cautious but not experiencing constant discomfort.       Prior to Admission medications    Medication Sig Start Date End Date Taking? 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    Social  History     Tobacco Use    Smoking status: Never    Smokeless tobacco: Never   Vaping Use    Vaping status: Never Used   Substance Use Topics    Alcohol use: Not Currently     Comment: socially    Drug use: Never          Review of Systems     Objective       Vitals:    11/30/23 1613   BP: 130/86   Pulse: 93   Resp: 18   SpO2: 98%   Weight: 83 kg (183 lb)        BP Readings from Last 3 Encounters:   11/30/23 130/86   11/18/23 122/74   11/15/23 132/74     Wt Readings from Last 3 Encounters:   11/30/23 83 kg (183 lb)    11/18/23 82.6 kg (182 lb)   11/15/23 83.9 kg (185 lb)       Physical Exam  GEN-No acute distress.  HEENT-Neck is supple, moist mucosa.   CV-Regular rate and rhythm. No murmurs, gallops, or rubs.   LUNGS-Normal respiratory effort. Clear to auscultation bilaterally. Good air movement.  EXT-No edema.   PSYCH-Normal mood and affect.          Admission on 11/15/2023, Discharged on 11/15/2023   Component Date Value Ref Range Status    Color, UA 11/15/2023 Yellow   Final    Appearance 11/15/2023 Clear   Final    Glucose, Ur 11/15/2023 Negative  Negative Final    Bilirubin, Urine 11/15/2023 Negative  Negative Final    Ketones, Urine 11/15/2023 Negative  Negative Final    Specific Gravity, UA 11/15/2023 1.015  1.003 - 1.035 Final    Blood, Urine 11/15/2023 Negative  Negative Final    pH, Urine 11/15/2023 6.5  4.5 - 8.0 Final    Protein, UA 11/15/2023 Negative  Negative Final    Urobilinogen, Urine 11/15/2023 0.2  0.2 - 1.0 EU/dL Final    Nitrite, Urine 11/15/2023 Negative  Negative Final    Leukocyte Esterase, Urine 11/15/2023 Negative  Negative Final    WBC 11/15/2023 6.7  3.8 - 10.6 x10e3/mcL Final    RBC 11/15/2023 4.40  4.00 - 5.60 x10e6/mcL Final    Hemoglobin 11/15/2023 13.7  13.0 - 17.3 g/dL Final    Hematocrit 16/03/9603 40.5  38.0 - 52.0 % Final    MCV 11/15/2023 92.0  84.0 - 100.0 fL Final    MCH 11/15/2023 31.1  27.0 - 34.5 pg Final    MCHC 11/15/2023 33.8  30.0 - 36.0 g/dL Final    RDW 54/02/8118 12.0  10.0 - 17.0 % Final    Platelets 11/15/2023 173  140 - 440 x10e3/mcL Final    MPV 11/15/2023 9.7  7.0 - 12.2 fL Final    Neutrophils % 11/15/2023 58.2  42.0 - 74.0 % Final    Lymphocytes 11/15/2023 32.9  15.0 - 45.0 % Final    Monocytes % 11/15/2023 7.1  4.0 - 12.0 % Final    Eosinophils % 11/15/2023 1.5  0.0 - 7.0 % Final    Basophils % 11/15/2023 0.3  0.0 - 2.0 % Final    Neutrophils Absolute 11/15/2023 3.9  1.6 - 7.3 x10e3/mcL Final    Lymphocytes Absolute 11/15/2023 2.2  1.0 - 3.2 x10e3/mcL Final    Monocytes  Absolute 11/15/2023 0.5  0.3 - 1.0 x10e3/mcL Final    Eosinophils Absolute 11/15/2023 0.1  0.0 - 0.5 x10e3/mcL  Final    Basophils Absolute 11/15/2023 0.0  0.0 - 0.2 x10e3/mcL Final    Immature Granulocytes % 11/15/2023 0.0  0.0 - 0.6 % Final    Immature Grans (Abs) 11/15/2023 0.00  0.00 - 0.06 x10e3/mcL Final    Sodium 11/15/2023 139  135 - 145 mmol/L Final    Potassium 11/15/2023 4.1  3.5 - 5.3 mmol/L Final    Chloride 11/15/2023 103  98 - 107 mmol/L Final    CO2 11/15/2023 27  22 - 29 mmol/L Final    Glucose 11/15/2023 93  70 - 99 mg/dL Final    BUN 40/98/1191 19  6 - 20 mg/dL Final    Creatinine 47/82/9562 0.9  0.7 - 1.3 mg/dL Final    Anion Gap 13/01/6577 10  2 - 17 mmol/L Final    Osmolaliy Calculated 11/15/2023 279  270 - 287 mOsm/kg Final    Calcium 11/15/2023 8.9  8.5 - 10.7 mg/dL Final    Total Protein 11/15/2023 6.5  5.7 - 8.3 g/dL Final    Albumin 46/96/2952 4.1  3.5 - 5.2 g/dL Final    Globulin 84/13/2440 2.3  1.9 - 4.4 g/dL Final    Albumin/Globulin Ratio 11/15/2023 1.78  1.00 - 2.70 Final    Total Bilirubin 11/15/2023 0.73  0.00 - 1.20 mg/dL Final    Alk Phosphatase 11/15/2023 70  40 - 130 unit/L Final    AST 11/15/2023 27  0 - 46 unit/L Final    ALT 11/15/2023 38  0 - 42 unit/L Final    Est, Glom Filt Rate 11/15/2023 122  >=60 mL/min/1.82m Final    Comment: VERIFIED by Discern Expert.  GFR Interpretation:                                                                         % OF  KIDNEY  GFR                                                        STAGE  FUNCTION  ==================================================================================    > 90        Normal kidney function                       STAGE 1  90-100%  89 to 60      Mild loss of kidney function                 STAGE 2  80-60%  59 to 45      Mild to moderate loss of kidney function     STAGE 3a  59-45%  44 to 30      Moderate to severe loss of kidney function   STAGE 3b  44-30%  29 to 15      Severe loss of kidney function                STAGE 4  29-15%    < 15        Kidney failure  STAGE 5  <15%  ==================================================================================  Modified from [employer]    GFR Calculation performed using the CKD-EPI 2021 equation developed for use  with IDMS traceable creatinine methods and                            is the calculation recommended by  the [facility] for estimating GFR in adults.        No results found for this visit on 11/30/23.     The patient (or guardian, if applicable) and other individuals in attendance with the patient were advised that Artificial Intelligence will be utilized during this visit to record and process the conversation to generate a clinical note. The patient (or guardian, if applicable) and other individuals in attendance at the appointment consented to the use of AI, including the recording.                          An electronic signature was used to authenticate this note.    --Dessie Flow, MD

## 2024-01-30 ENCOUNTER — Inpatient Hospital Stay
Admit: 2024-01-30 | Discharge: 2024-01-30 | Disposition: A | Discharge status: Home / Self Care | Payer: PRIVATE HEALTH INSURANCE | Arrived: VH

## 2024-01-30 ENCOUNTER — Emergency Department: Admit: 2024-01-30 | Payer: PRIVATE HEALTH INSURANCE | Primary: Family Medicine

## 2024-01-30 DIAGNOSIS — R102 Pelvic and perineal pain: Principal | ICD-10-CM

## 2024-01-30 LAB — CBC WITH AUTO DIFFERENTIAL
Basophils %: 0.3 % (ref 0.0–2.0)
Basophils Absolute: 0 x10e3/mcL (ref 0.0–0.2)
Eosinophils %: 2.8 % (ref 0.0–7.0)
Eosinophils Absolute: 0.2 x10e3/mcL (ref 0.0–0.5)
Hematocrit: 44 % (ref 38.0–52.0)
Hemoglobin: 14.9 g/dL (ref 13.0–17.3)
Immature Grans (Abs): 0.02 x10e3/mcL (ref 0.00–0.06)
Immature Granulocytes %: 0.3 % (ref 0.0–0.6)
Lymphocytes Absolute: 2.7 x10e3/mcL (ref 1.0–3.2)
Lymphocytes: 45 % (ref 15.0–45.0)
MCH: 30.8 pg (ref 27.0–34.5)
MCHC: 33.9 g/dL (ref 30.0–36.0)
MCV: 90.9 fL (ref 84.0–100.0)
MPV: 9.4 fL (ref 7.0–12.2)
Monocytes %: 7.7 % (ref 4.0–12.0)
Monocytes Absolute: 0.5 x10e3/mcL (ref 0.3–1.0)
Neutrophils %: 43.9 % (ref 42.0–74.0)
Neutrophils Absolute: 2.7 x10e3/mcL (ref 1.6–7.3)
Platelets: 176 x10e3/mcL (ref 140–440)
RBC: 4.84 x10e6/mcL (ref 4.00–5.60)
RDW: 11.8 % (ref 10.0–17.0)
WBC: 6.1 x10e3/mcL (ref 3.8–10.6)

## 2024-01-30 LAB — URINALYSIS W/ RFLX MICROSCOPIC
Bilirubin, Urine: NEGATIVE
Blood, Urine: NEGATIVE
Glucose, Ur: NEGATIVE
Ketones, Urine: NEGATIVE
Leukocyte Esterase, Urine: NEGATIVE
Nitrite, Urine: NEGATIVE
Protein, UA: NEGATIVE
Specific Gravity, UA: 1.025 (ref 1.003–1.035)
Urobilinogen, Urine: 0.2 EU/dL (ref 0.2–1.0)
pH, Urine: 6 (ref 4.5–8.0)

## 2024-01-30 LAB — COMPREHENSIVE METABOLIC PANEL
ALT: 38 U/L (ref 0–42)
AST: 24 U/L (ref 0–46)
Albumin/Globulin Ratio: 1.61 (ref 1.00–2.70)
Albumin: 4.2 g/dL (ref 3.5–5.2)
Alk Phosphatase: 72 U/L (ref 40–130)
Anion Gap: 11 mmol/L (ref 2–17)
BUN: 26 mg/dL — ABNORMAL HIGH (ref 6–20)
CO2: 24 mmol/L (ref 22–29)
Calcium: 8.9 mg/dL (ref 8.5–10.7)
Chloride: 103 mmol/L (ref 98–107)
Creatinine: 0.8 mg/dL (ref 0.7–1.3)
Est, Glom Filt Rate: 127 mL/min/1.73mÂ² (ref >=60)
Globulin: 2.6 g/dL (ref 1.9–4.4)
Glucose: 94 mg/dL (ref 70–99)
Osmolaliy Calculated: 280 mosm/kg (ref 270–287)
Potassium: 4.2 mmol/L (ref 3.5–5.3)
Sodium: 138 mmol/L (ref 135–145)
Total Bilirubin: 0.95 mg/dL (ref 0.00–1.20)
Total Protein: 6.8 g/dL (ref 5.7–8.3)

## 2024-01-30 LAB — C.TRACHOMATIS N.GONORRHOEAE T. VAGINALI, MOLECULAR, URINE
Chlamydia Trachomatis, NAA, Urine: NEGATIVE
Neisseria gonorrhoeae, NAA, Urine: NEGATIVE
Trichomonas vaginalis DNA, Urine: NEGATIVE

## 2024-01-30 LAB — LIPASE: Lipase: 29 U/L (ref 13–60)

## 2024-01-30 MED ORDER — KETOROLAC TROMETHAMINE 30 MG/ML IJ SOLN
30 | Freq: Once | INTRAMUSCULAR | Status: DC
Start: 2024-01-30 — End: 2024-01-30

## 2024-01-30 MED ORDER — OXYCODONE-ACETAMINOPHEN 5-325 MG PO TABS
5-325 | ORAL_TABLET | Freq: Four times a day (QID) | ORAL | 0 refills | 7.00000 days | Status: AC | PRN
Start: 2024-01-30 — End: 2024-02-02

## 2024-01-30 MED ORDER — KETOROLAC TROMETHAMINE 15 MG/ML IJ SOLN
15 | Freq: Once | INTRAMUSCULAR | Status: AC
Start: 2024-01-30 — End: 2024-01-30

## 2024-01-30 MED ORDER — IOPAMIDOL 61 % IV SOLN
61 | Freq: Once | INTRAVENOUS | Status: AC | PRN
Start: 2024-01-30 — End: 2024-01-30
  Administered 2024-01-30: 08:00:00 100 mL via INTRAVENOUS

## 2024-01-30 MED ORDER — KETOROLAC TROMETHAMINE 15 MG/ML IJ SOLN
15 | INTRAMUSCULAR | Status: AC
Start: 2024-01-30 — End: 2024-01-30
  Administered 2024-01-30: 08:00:00 15 via INTRAVENOUS

## 2024-01-30 MED FILL — KETOROLAC TROMETHAMINE 15 MG/ML IJ SOLN: 15 mg/mL | INTRAMUSCULAR | Qty: 1 | Fill #0

## 2024-01-30 MED FILL — ISOVUE-300 61 % IV SOLN: 61 % | INTRAVENOUS | Qty: 100 | Fill #0

## 2024-01-30 NOTE — Telephone Encounter (Signed)
 Ed follow up  Pain in groin area dead center  Ct scan and ultra sound and  urine   Please call and assist  thank you    He wants  a call today

## 2024-01-30 NOTE — Telephone Encounter (Signed)
 Spoke with patient and scheduled appt for tomorrow.

## 2024-01-30 NOTE — ED Provider Notes (Signed)
 [facility] EMERGENCY DEPARTMENT  EMERGENCY DEPARTMENT ENCOUNTER      Pt Name: Raymond Holder  MRN: [id number]  Birthdate 23-Jul-2000  Date of evaluation: 01/30/2024  Provider: Dorn Copa, MD  Provider evaluation time: 01/30/24 0321    CHIEF COMPLAINT       Chief Complaint   Patient presents with    Scrotal Pain     Pt complains of scrotal pain that he has had for the last 3 months. Pt has been seen for this in  the er, pcp, and urology.        HISTORY OF PRESENT ILLNESS    Patient is a 23 year old male with history of testicular torsion who presents for perineal pain.  Patient states he has had this pain for approximately 3 months.  Patient states he was fishing and had an awkward movement and felt some pain in this region.  Patient describes it as burning.  Patient was seen in the ER here previously and had a CAT scan and ultrasound that were unremarkable.  Patient states he followed up with his primary care and urology.  Patient states he went to the urologist within the last couple days.  They told him there was no acute issue.  Patient states the same pain persist.  Is not worse or different in quality.  Patient says he wants a repeat evaluation.  No testicular pain.    The history is provided by the patient.       Nursing Notes were reviewed.    REVIEW OF SYSTEMS     Review of Systems   Constitutional:  Negative for fever.   Respiratory:  Negative for shortness of breath.    Cardiovascular:  Negative for chest pain and palpitations.   Gastrointestinal:  Negative for abdominal pain, nausea and vomiting.   Genitourinary:  Negative for difficulty urinating, dysuria, flank pain, penile discharge, penile pain, penile swelling, scrotal swelling, testicular pain and urgency.        Perineal pain   Musculoskeletal:  Negative for back pain, neck pain and neck stiffness.   Skin:  Negative for rash.   Neurological:  Negative for dizziness, weakness and headaches.   All other systems  reviewed and are negative.      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    FAMILY & SOCIAL HISTORY     History reviewed. No pertinent family history.     Social History     Socioeconomic History    Marital status: Single     Spouse name: None    Number of children: None    Years of education: None    Highest education level: None   Tobacco Use    Smoking status: Never    Smokeless tobacco: Never   Vaping Use    Vaping status: Never Used   Substance  and Sexual Activity    Alcohol use: Not Currently     Comment: socially    Drug use: Never       PHYSICAL EXAM       ED Triage Vitals [01/30/24 0327]   BP Systolic BP Percentile Diastolic BP Percentile Temp Temp Source Pulse Respirations SpO2   (!) 172/84 -- -- 98 F (36.7 C) Oral 57 18 100 %      Height Weight - Scale         1.88 m (6' 2) 83.9 kg (185 lb)             Physical Exam  Vitals and nursing note reviewed. Exam conducted with a chaperone present.   Constitutional:       General: He is not in acute distress.  HENT:      Head: Normocephalic and atraumatic.      Mouth/Throat:      Comments: Airway patent  Cardiovascular:      Rate and Rhythm: Normal rate.      Comments: Normal peripheral perfusion  Pulmonary:      Effort: Pulmonary effort is normal. No respiratory distress.   Genitourinary:     Comments: Testicular is nontender nonswollen.  Peritoneal abnormality on physical exam.  No swelling or erythema  Musculoskeletal:         General: No deformity.   Skin:     General: Skin is warm and dry.   Neurological:       Mental Status: He is alert and oriented to person, place, and time. Mental status is at baseline.      Comments: Alert; Moving all extremities   Psychiatric:         Mood and Affect: Mood normal.         Behavior: Behavior normal.       Procedures    DIAGNOSTIC RESULTS     CT PELVIS W CONTRAST Additional Contrast? None   Preliminary Result      No definite CT evidence of acute abnormality in the perineum.        No abscess.         Finalized 4:29 AM [place] time   Dr. Everett Acres, MD   This report has been electronically signed and verified by the Radiologist whose    name is printed above.          This report contains privileged and confidential information and is intended    solely for the use of the individual or entity to which it is addressed. If you    are not the intended recipient of this report, you are hereby notified that any    copying, distribution, dissemination or action taken in relation to the contents    of this report is strictly prohibited and may be unlawful. If you have received    this report in error, please notify the sender immediately at [phone] and    permanently delete the original report and destroy any copies or printouts.                     ED COURSE, REASSESSMENT and MDM:            Medical Decision Making  Patient is having this pain for 3 months.  There is no change in quality or degree of pain.  Patient has already seen primary care as well as urology.  There is no signs for testicular torsion.  No signs for cellulitis  or abscess.  Per history it sounds.  This could be muscular in nature.  Will refer patient to orthopedic surgery.  Do not evaluate any acute emergent process at this point in time.  Labs and CT unremarkable. I personally discussed results of labs and imaging with patient.  Patient understand strict return to ED precautions as well as proper outpatient follow-up with primary care physician and/or specialist.  I discussed over-the-counter medications as well  as any side effects of medications prescribed and recommended they discuss further with pharmacist.      Amount and/or Complexity of Data Reviewed  Labs: ordered.  Radiology: ordered.    Risk  Prescription drug management.          FINAL IMPRESSION      1. 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  Verbal consent was obtained from the patient and/or guardian prior to use of the application.)    Dorn Copa, MD (electronically signed)  Emergency Medicine     Copa Dorn, MD  01/30/24 [phone]

## 2024-01-31 ENCOUNTER — Encounter: Attending: Orthopaedic Surgery | Primary: Family Medicine

## 2024-02-03 ENCOUNTER — Encounter

## 2024-03-01 ENCOUNTER — Encounter: Payer: PRIVATE HEALTH INSURANCE | Attending: Family Medicine | Primary: Family Medicine

## 2024-03-15 LAB — HEPATITIS B SURFACE ANTIBODY
Hep B S Ab Interp: NON-IMMUNE/NOT IMMUNE
Hep B S Ab: <3.5 — ABNORMAL LOW (ref 11.5–999.0)

## 2024-03-17 LAB — QUANTIFERON (R) TB GOLD: Quantiferon TB Gold Plus: NEGATIVE

## 2024-03-17 LAB — QUANTIFERON TB GOLD PLUS
QuantiFERON Mitogen: 7.33 [IU]/mL
QuantiFERON Nil: 0.06 [IU]/mL
QuantiFERON TB1 Ag Value: 0.1 [IU]/mL
QuantiFERON TB2 Ag Value: 0.09 [IU]/mL

## 2024-05-02 ENCOUNTER — Ambulatory Visit
Admit: 2024-05-02 | Discharge: 2024-05-02 | Payer: PRIVATE HEALTH INSURANCE | Attending: Family Medicine | Primary: Family Medicine

## 2024-05-02 VITALS — BP 124/70 | HR 87 | Resp 17 | Wt 175.0 lb

## 2024-05-02 DIAGNOSIS — F419 Anxiety disorder, unspecified: Principal | ICD-10-CM

## 2024-05-02 MED ORDER — FLUOXETINE HCL 10 MG PO CAPS
10 | ORAL_CAPSULE | ORAL | 0 refills | 90.00000 days | Status: DC
Start: 2024-05-02 — End: 2024-06-06

## 2024-05-06 NOTE — Progress Notes (Signed)
 "  Raymond Holder (DOB:  08-17-2000) is a 23 y.o. male, here for evaluation of the following chief complaint(s):  Anxiety        Assessment & Plan   ASSESSMENT/PLAN:  1. Anxiety  -     FLUoxetine  (PROZAC ) 10 MG capsule; Take 1 capsule by mouth daily for 10 days, THEN 2 capsules daily., Disp-70 capsule, R-0Normal  -     External Referral To Counseling Services      Assessment & Plan  1. Anxiety:  GI symptoms are likely due to anxiety. Prozac  10 mg daily has been prescribed for 10 days, followed by 20 mg daily from day 11. The patient has been referred to [facility] for counseling, and cognitive behavioral therapy was discussed. It is advised to take Prozac  with food to mitigate headaches and nausea, and to switch to nighttime if drowsiness occurs. Potential side effects such as sexual dysfunction, decreased libido, and erectile dysfunction were discussed, and the patient was advised to report any adverse effects.    Follow-up: In 4 weeks.       Return in about 4 weeks (around 05/30/2024) for medication .       Subjective   SUBJECTIVE/OBJECTIVE:  HPI     History of Present Illness  The patient presents for evaluation of anxiety.    Anxiety  - Reports racing thoughts, feelings of being overwhelmed, and gastrointestinal issues (upset stomachs, diarrhea, stomach cramps) occurring 2-3 days per week over the past 2 weeks  - Struggles to control his worrying, affecting work, home responsibilities, and social interactions  - Not experiencing restlessness  - Symptoms began in college with occasional upset stomachs, linked to racing thoughts and dietary choices  - Symptoms have worsened gradually over the past 7 years since college graduation    Sleep Disturbances  - Reports sleep disturbances and daytime fatigue    Medication  - Has been on gabapentin for 3 months for pelvic pain with significant mental side effects  - Discontinuing gabapentin, with 2 weeks remaining  - Gabapentin was prescribed for perineum pain, later  identified as muscle tightness by a pelvic floor physical therapist  - Continues to manage this condition    Education Level: Engineer, maintenance (it)  Occupation: Works in patient access at a hospital  Sleep: Reports sleep disturbances and daytime fatigue       Prior to Admission medications   Medication Sig Start Date End Date Taking? Authorizing Provider   gabapentin (NEURONTIN) 400 MG capsule  04/19/24  Yes [provider]   FLUoxetine  (PROZAC ) 10 MG capsule Take 1 capsule by mouth daily for 10 days, THEN 2 capsules daily. 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    Social History     Tobacco Use    Smoking status: Never    Smokeless tobacco: Never   Vaping Use    Vaping  status: Never Used   Substance Use Topics    Alcohol use: Not Currently     Comment: socially    Drug use: Never          Review of Systems     Objective       Vitals:    05/02/24 1508   BP: 124/70   Pulse: 87   Resp: 17   SpO2: 99%   Weight: 79.4 kg (175 lb)        BP Readings from Last 3 Encounters:   05/02/24 124/70   01/30/24 (!) 172/84   11/30/23 130/86     Wt Readings from Last 3 Encounters:   05/02/24 79.4 kg (175 lb)   01/30/24 83.9 kg (185 lb)   11/30/23 83 kg (183 lb)       Physical Exam  GEN-No acute distress.  HEENT-Neck is supple, moist mucosa.   CV-Regular rate and rhythm. No murmurs, gallops, or rubs.   LUNGS-Normal respiratory effort. Clear to auscultation bilaterally. Good air movement.  EXT-No edema.   PSYCH-Normal mood and affect.           Orders Only on 03/15/2024   Component Date Value Ref Range Status    Hep B S Ab 03/15/2024 <3.5 (L)  11.5 - 999.0 Final    Hep B S Ab Interp 03/15/2024 Non-Immune   Final    Quantiferon TB Gold Plus 03/15/2024 Negative  Negative Final    Comment: No response to M tuberculosis antigens detected.  Infection with M tuberculosis is unlikely, but high risk  individuals should be considered for additional testing  (ATS/IDSA/CDC Clinical Practice Guidelines, 2017). The  reference range is an Antigen minus Nil result of <0.35  IU/mL.  Chemiluminescence immunoassay methodology  Performed At: [facility]  [address] [place], [place] [id number]  Jennette Shorter MD Ey:[id number]      Quantiferon(r) TB Gold (Incubated) 03/15/2024 Incubation performed.   Final    Comment: Performed At: [facility]  [address] [place], [place] [id number]  Jennette Shorter MD Ey:[id number]      Quantiferon Criteria 03/15/2024 Comment   Final    Comment:   QuantiFERON-TB Gold Plus is a qualitative indirect test for  M tuberculosis infection (including disease) and is  intended for use in conjunction with risk assessment,  radiography, and other medical and diagnostic evaluations.  The QuantiFERON-TB Gold Plus result is determined by  subtracting the Nil value from either TB antigen (Ag)  value. The Mitogen tube serves as a control for the test.      QuantiFERON TB1 Ag Value 03/15/2024 0.10  IU/mL Final    QuantiFERON TB2 Ag Value 03/15/2024 0.09  IU/mL Final    QuantiFERON Nil 03/15/2024 0.06  IU/mL Final    QuantiFERON Mitogen 03/15/2024 7.33  IU/mL Final    Comment: Performed At: [facility]  [address] [place], [place] [id number]  Jennette Shorter MD Ey:[id number]        No results found for this visit on 05/02/24.     The patient (or guardian, if applicable) and other individuals in attendance with the patient were advised that Artificial Intelligence will be utilized during this visit to record and process  the conversation to generate a clinical note. The patient (or guardian, if applicable) and other individuals in attendance at the appointment consented to the use of AI, including the recording.  An electronic signature was used to authenticate this note.    --Sherlean CHRISTELLA Bride, MD      "

## 2024-05-11 ENCOUNTER — Encounter

## 2024-05-11 LAB — HEPATITIS B SURFACE ANTIBODY
Hep B S Ab Interp: NON-IMMUNE/NOT IMMUNE
Hep B S Ab: <3.5 — ABNORMAL LOW (ref 10.0–999.0)

## 2024-05-27 ENCOUNTER — Encounter

## 2024-05-30 ENCOUNTER — Encounter: Payer: PRIVATE HEALTH INSURANCE | Attending: Family Medicine | Primary: Family Medicine

## 2024-06-06 ENCOUNTER — Ambulatory Visit
Admit: 2024-06-06 | Discharge: 2024-06-06 | Payer: PRIVATE HEALTH INSURANCE | Attending: Family Medicine | Primary: Family Medicine

## 2024-06-06 VITALS — BP 126/80 | HR 80 | Resp 12

## 2024-06-06 DIAGNOSIS — F419 Anxiety disorder, unspecified: Principal | ICD-10-CM

## 2024-06-06 MED ORDER — FLUOXETINE HCL 40 MG PO CAPS
40 | ORAL_CAPSULE | Freq: Every day | ORAL | 5 refills | Status: AC
Start: 2024-06-06 — End: ?

## 2024-06-06 NOTE — Progress Notes (Signed)
 "  Raymond Holder (DOB:  03-21-2001) is a 23 y.o. male, here for evaluation of the following chief complaint(s):  Anxiety        Assessment & Plan   ASSESSMENT/PLAN:  1. Anxiety  -     FLUoxetine  (PROZAC ) 40 MG capsule; Take 1 capsule by mouth daily, Disp-30 capsule, R-5Normal      Assessment & Plan  1. Anxiety:  Counseling has been beneficial, but no significant improvement from the current dose of Prozac . GI symptoms have improved, but chest tightness persists without major changes. Increase Prozac  to 40 mg daily. Continue current prescription until exhausted, then commence new dosage. Discussed potential side effects, including emotional numbing and sexual dysfunction. Report any adverse effects to consider reducing dosage back to 20 mg.    Follow-up: 08/2024.       Return in about 3 months (around 09/04/2024) for anxiety .       Subjective   SUBJECTIVE/OBJECTIVE:  HPI     History of Present Illness  The patient presents for evaluation of anxiety.    Anxiety  - He reports a positive response to Prozac  initiated during his last visit but no significant changes since starting the medication.  - He is open to increasing the dosage if appropriate.  - Counseling sessions through [facility] have been beneficial, improving his overall well-being.  - He has been on Prozac  20 mg twice daily for two weeks   - Counseling has equipped him with strategies to manage overthinking and anxiety, identifying triggers.  - He reports improved gastrointestinal symptoms linked to anxiety.  - He experiences occasional chest tightness with no major changes.  - He reports improved focus on tasks, such as house maintenance, previously neglected due to anxiety.       Prior to Admission medications   Medication Sig Start Date End Date Taking? 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    Social History     Tobacco Use    Smoking status: Never    Smokeless tobacco: Never   Vaping Use    Vaping status: Never Used   Substance Use Topics    Alcohol use: Not Currently     Comment: socially    Drug use: Never          Review of Systems     Objective       Vitals:    06/06/24 0826   BP: 126/80   BP Site: Left Upper Arm   Patient Position: Sitting   Pulse: 80   Resp: 12   SpO2: 98%        BP Readings from Last 3 Encounters:   06/06/24 126/80   05/02/24 124/70  01/30/24 (!) 172/84     Wt Readings from Last 3 Encounters:   05/02/24 79.4 kg (175 lb)   01/30/24 83.9 kg (185 lb)   11/30/23 83 kg (183 lb)       Physical Exam  GEN-No acute distress.  HEENT-Neck is supple, moist mucosa.   CV-Regular rate and rhythm. No murmurs, gallops, or rubs.   LUNGS-Normal respiratory effort. Clear to auscultation bilaterally. Good air movement.  EXT-No edema.   PSYCH-Normal mood and affect.          Orders Only on 05/11/2024   Component Date Value Ref Range Status    Hep B S Ab 05/11/2024 <3.5 (L)  10.0 - 999.0 Final    Hep B S Ab Interp 05/11/2024 Non-Immune   Final      No results found for this visit on 06/06/24.     The patient (or guardian, if applicable) and other individuals in attendance with the patient were advised that Artificial Intelligence will be utilized during this visit to record and process the conversation to generate a clinical note. The patient (or guardian, if applicable) and other individuals in attendance at the  appointment consented to the use of AI, including the recording.                          An electronic signature was used to authenticate this note.    --Sherlean CHRISTELLA Bride, MD     "

## 2024-07-26 ENCOUNTER — Inpatient Hospital Stay
Admit: 2024-07-26 | Discharge: 2024-07-26 | Disposition: A | Discharge status: Home / Self Care | Payer: PRIVATE HEALTH INSURANCE | Arrived: WI

## 2024-07-26 ENCOUNTER — Emergency Department: Admit: 2024-07-26 | Payer: PRIVATE HEALTH INSURANCE | Primary: Family Medicine

## 2024-07-26 DIAGNOSIS — S43004A Unspecified dislocation of right shoulder joint, initial encounter: Principal | ICD-10-CM

## 2024-07-26 NOTE — Discharge Instructions (Signed)
 Remain in sling and follow-up closely with orthopedics by calling the number below.  Take ibuprofen  or naproxen  as needed for pain and you may alternate with Tylenol  as needed.  Return to the ER if experience any new or worsening symptoms of concern in the meantime.

## 2024-07-26 NOTE — ED Provider Notes (Signed)
 [facility] EMERGENCY DEPARTMENT  EMERGENCY DEPARTMENT ENCOUNTER      Pt Name: Raymond Holder  MRN: [id number]  Birthdate [birth year]  Date of evaluation: 07/26/2024  Provider evaluation time: 07/26/24 1719  Provider: Richerd CHRISTELLA Shy, PA-C    CHIEF COMPLAINT       Chief Complaint   Patient presents with    Shoulder Injury     Hx of shoulder dislocations w/ surgical anchoring. Pt was reaching for items at store far back on shelf and d/t static electricity immediately pulled back and felt dislocation. Pt believes there was reduction but wants imaging to ensure correct location. R shoulder guarding.         HISTORY OF PRESENT ILLNESS    HPI   24 y.o. male presents to ED c/o episode of right shoulder dislocation that occurred prior to arrival.  Patient reports previous history of frequent shoulder dislocations and [place].  Reports since that time he has not had any recurrent episodes until today when he was shopping in [place], was reaching shelf and received a static shock and jerked his hand backward quickly and felt his shoulder dislocate.  Reports he was able to reduce the dislocation himself but wants an x-ray to be sure that it is appropriately reduced.  Reports he was able to range his shoulder following self reduction but states it is limited and he is guarding due to concern for re-dislocation.  Denies any extremity  numbness, tingling, or weakness.       REVIEW OF SYSTEMS       Review of Systems   All other systems reviewed and are negative.      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 No pertinent family history.     SOCIAL HISTORY       Social History     Socioeconomic History    Marital status: Single     Spouse name: None    Number of children: None    Years of education: None    Highest education level: None   Tobacco Use    Smoking status: Never    Smokeless tobacco: Never   Vaping Use    Vaping status: Never Used   Substance and Sexual Activity    Alcohol use: Not Currently     Comment: socially    Drug use: Never       PHYSICAL EXAM     Vitals:    Vitals:    07/26/24 1722   BP: (!) 146/81   Pulse: 80   Resp: 16   Temp: 98.6 F (37 C)   TempSrc: Oral   SpO2: 99%   Weight: 81.6 kg (180 lb)   Height: 1.88 m (6' 2)  Physical Exam  Vitals and nursing note reviewed.   Constitutional:       General: He is not in acute distress.     Appearance: Normal appearance. He is not ill-appearing, toxic-appearing or diaphoretic.   HENT:      Head: Normocephalic and atraumatic.   Cardiovascular:      Pulses: Normal pulses.   Musculoskeletal:      Right shoulder: Tenderness present. No swelling, deformity, effusion or crepitus. Decreased range of motion (limited and guarding 2/2 recent dislocation, normal passive ROM). Normal pulse.      Comments: NVI distally   Skin:     General: Skin is warm and dry.      Capillary Refill: Capillary refill takes less than 2 seconds.   Neurological:      General: No focal deficit present.      Mental Status: He is alert and oriented to person, place, and time.         PROCEDURES:  Procedures    DIAGNOSTIC RESULTS       RADIOLOGY:   XR SHOULDER RIGHT (MIN 2 VIEWS)   Final Result   No acute osseous abnormality of the right shoulder.          LABS:  Labs Reviewed - No data to display    All other labs were within normal range or not returned as of this dictation.    EMERGENCY DEPARTMENT COURSE/MDM:   MDM  Pt is well appearing in NAD and I HDS. On  exam, there is mild TTP to anterior shoulder but no deformities present, no crepitus, NVI distally.  Patient is guarding but is able to touch opposite shoulder without difficulty and passive ROM is intact. Offered something for pain control but pt declined. Right shoulder XR's obtained which are normal. No signs of dislocation. Discussed results with pt. Shoulder appropriately reduced.  He was placed in sling with swath in ED.  States he is not established with an orthopedist locally and was referred for close follow-up with orthopedics.  Advised NSAIDs as needed for pain.  Discussed ED return precautions.  Patient understands and agrees with plan.  DC with return precautions.          CONSULTS:  None      FINAL IMPRESSION      1. Dislocation of right shoulder joint, initial encounter    2. Acute pain of right shoulder          DISPOSITION/PLAN   DISPOSITION Decision To Discharge 07/26/2024 05:54:15 PM   DISPOSITION CONDITION Stable           PATIENT REFERRED TO:  [facility] - [address]  [address]  [place] [address]  [place]  [address]  [phone]  Schedule an appointment as soon as possible for a visit         DISCHARGE MEDICATIONS:  New Prescriptions    No medications on file         (Please note that portions of this note were completed with a voice recognition program.  Efforts were made to edit the dictations but occasionally words are mis-transcribed.)    Kenichi Cassada M Sanchez Hemmer, PA-C (electronically signed)             Reuben Richerd HERO, PA-C  07/26/24 1804

## 2024-07-31 ENCOUNTER — Encounter

## 2024-07-31 ENCOUNTER — Ambulatory Visit: Admit: 2024-07-31 | Discharge: 2024-07-31 | Payer: PRIVATE HEALTH INSURANCE | Primary: Family Medicine

## 2024-07-31 DIAGNOSIS — M25311 Other instability, right shoulder: Principal | ICD-10-CM

## 2024-07-31 MED ORDER — MELOXICAM 15 MG PO TABS
15 | ORAL_TABLET | Freq: Every day | ORAL | 0 refills | 90.00000 days | Status: AC | PRN
Start: 2024-07-31 — End: ?

## 2024-08-10 ENCOUNTER — Ambulatory Visit: Payer: PRIVATE HEALTH INSURANCE | Primary: Family Medicine
